# Patient Record
Sex: Male | Born: 1935 | Race: White | Hispanic: No | Marital: Married | Smoking: Never smoker
Health system: Southern US, Community
[De-identification: ages and names within clinical notes are randomized; demographics above are authoritative.]

## PROBLEM LIST (undated history)

## (undated) DIAGNOSIS — E7800 Pure hypercholesterolemia, unspecified: Secondary | ICD-10-CM

## (undated) DIAGNOSIS — R001 Bradycardia, unspecified: Secondary | ICD-10-CM

## (undated) DIAGNOSIS — C801 Malignant (primary) neoplasm, unspecified: Secondary | ICD-10-CM

## (undated) DIAGNOSIS — N189 Chronic kidney disease, unspecified: Secondary | ICD-10-CM

## (undated) DIAGNOSIS — M1990 Unspecified osteoarthritis, unspecified site: Secondary | ICD-10-CM

## (undated) DIAGNOSIS — Z98811 Dental restoration status: Secondary | ICD-10-CM

## (undated) HISTORY — PX: MOHS SURGERY: SUR867

## (undated) HISTORY — PX: PAROTIDECTOMY: SUR1003

---

## 2005-06-06 ENCOUNTER — Emergency Department: Payer: Self-pay | Admitting: Emergency Medicine

## 2005-08-14 ENCOUNTER — Ambulatory Visit: Payer: Self-pay | Admitting: Internal Medicine

## 2007-05-15 IMAGING — US ABDOMEN ULTRASOUND
1 series · 17 of 25 positions shown · non-contrast
Comparison: none

REASON FOR EXAM: Elevated liver functions.  Evaluate for gallstones
COMMENTS:

[Series 1: abdomen ultrasound · 17 of 58 slices shown]
[im 1/58]
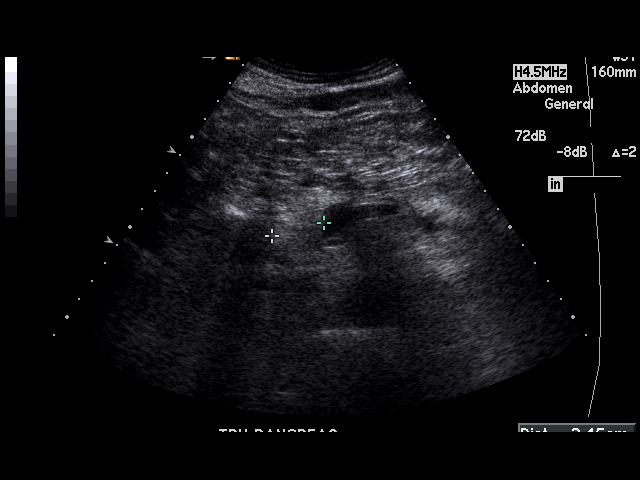
[im 5/58]
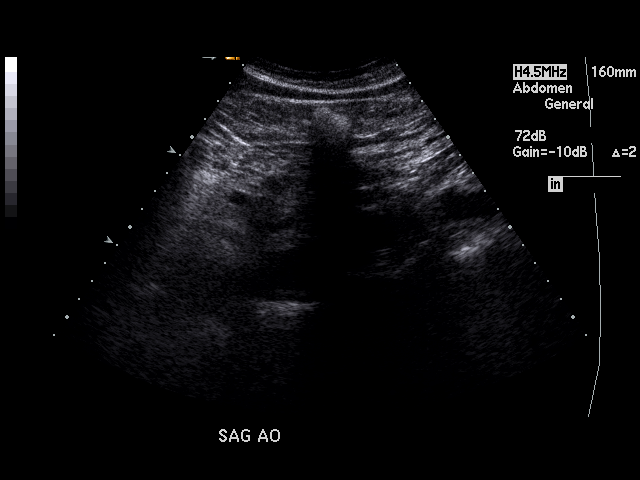
[im 8/58]
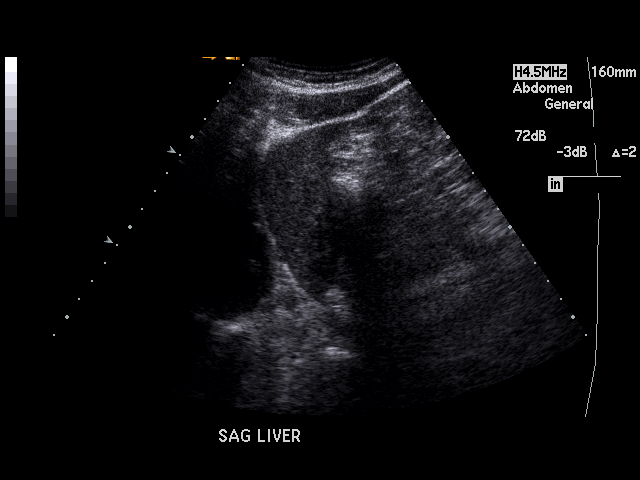
[im 12/58]
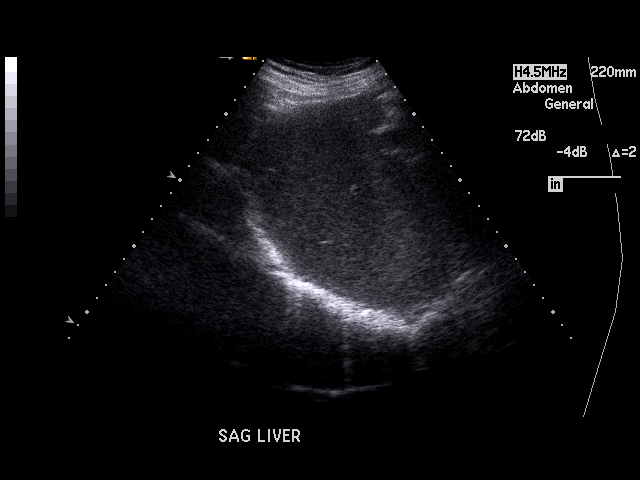
[im 15/58]
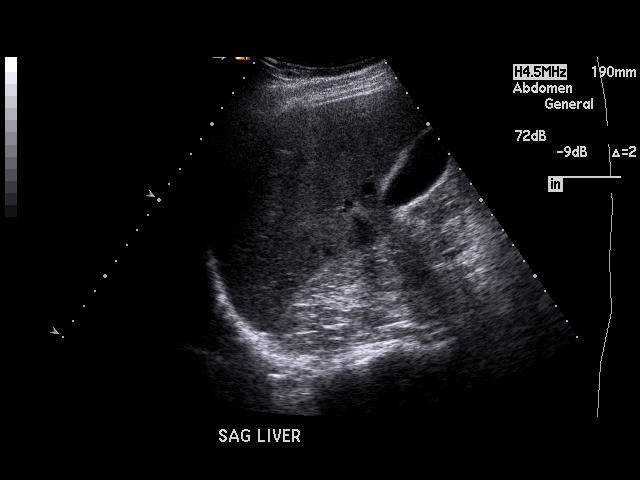
[im 20/58]
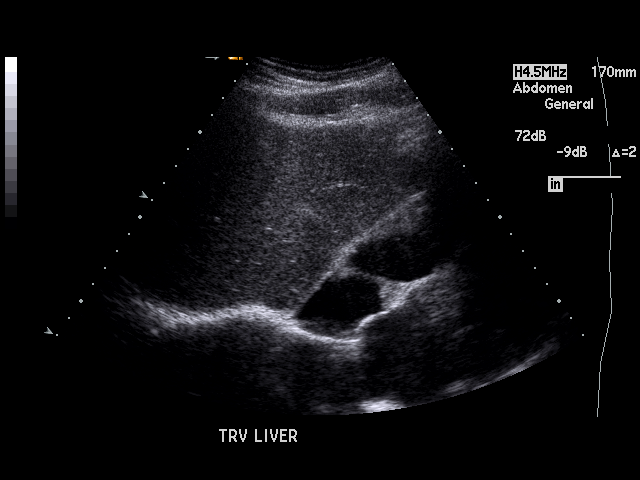
[im 22/58]
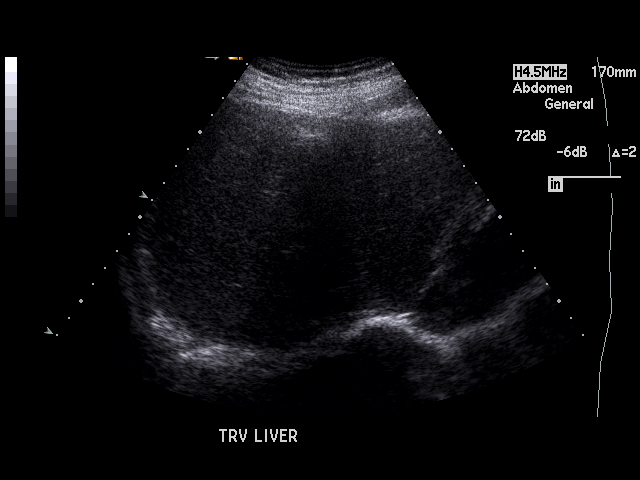
[im 27/58]
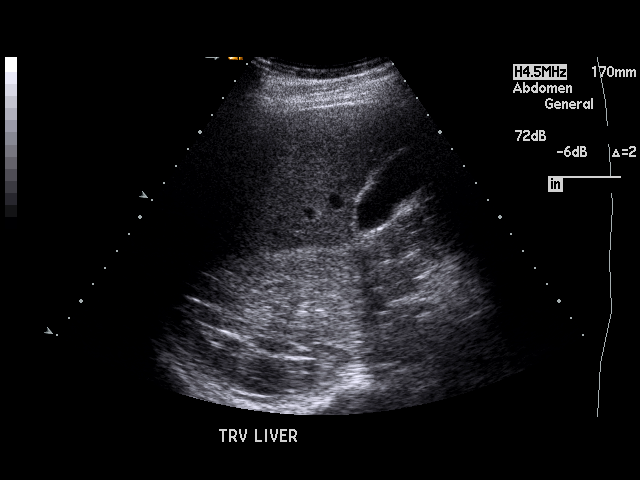
[im 29/58]
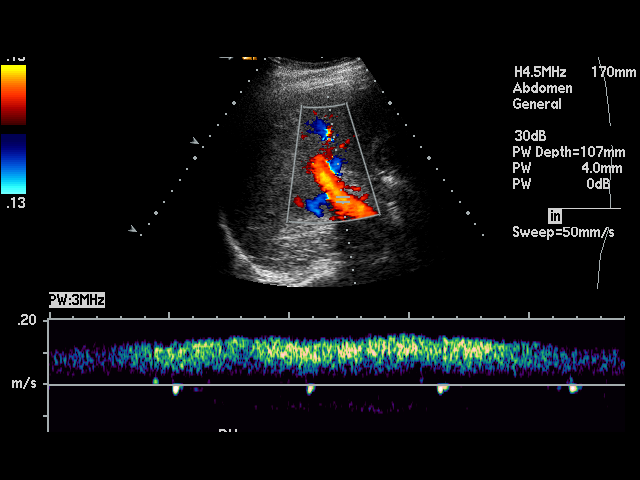
[im 31/58]
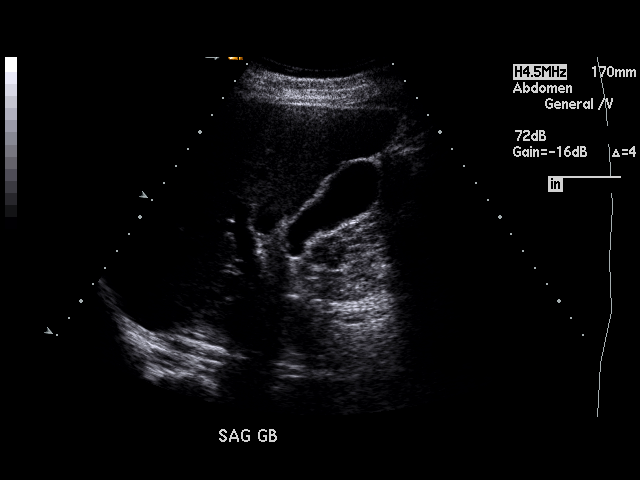
[im 36/58]
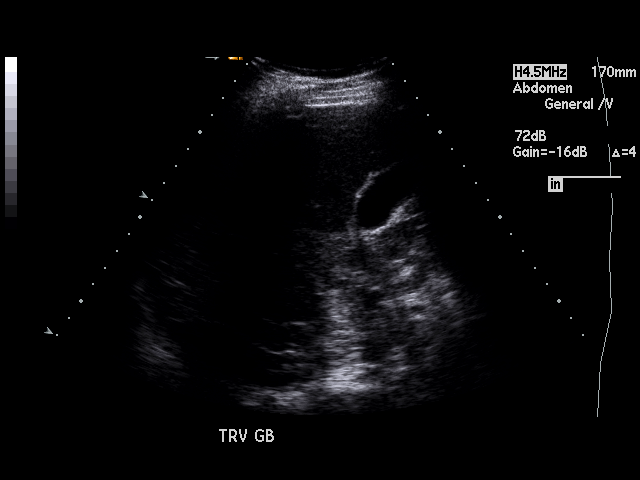
[im 39/58]
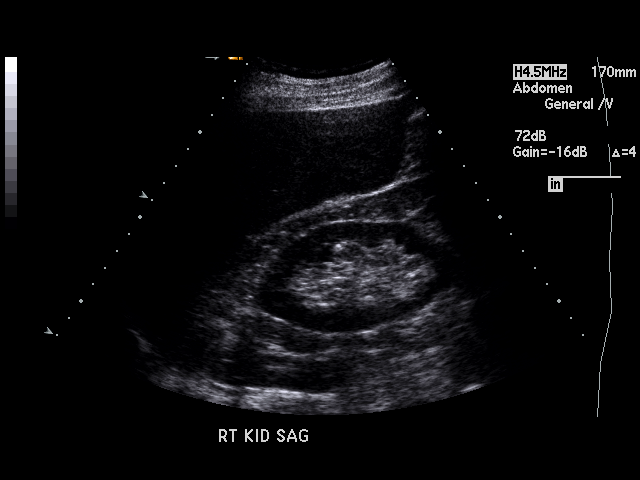
[im 43/58]
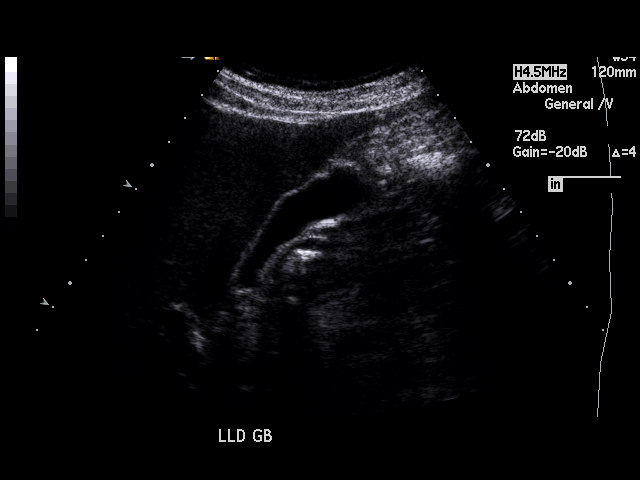
[im 46/58]
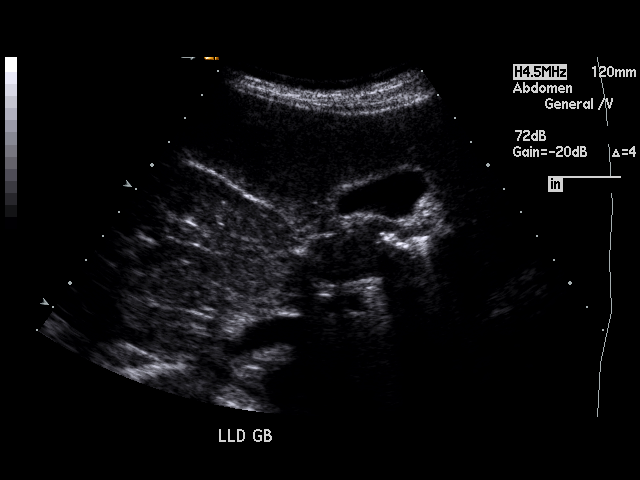
[im 50/58]
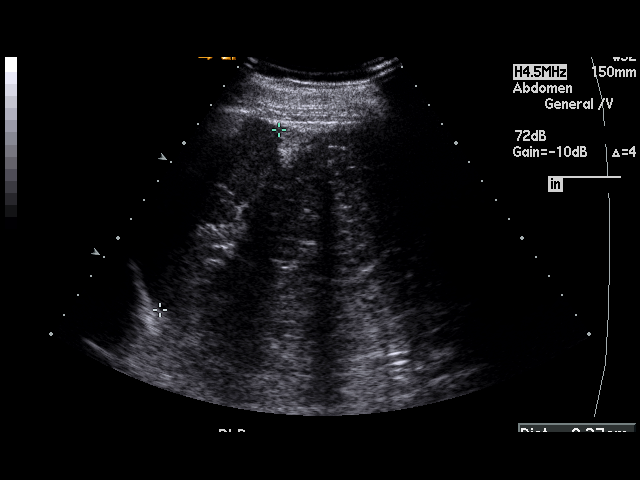
[im 53/58]
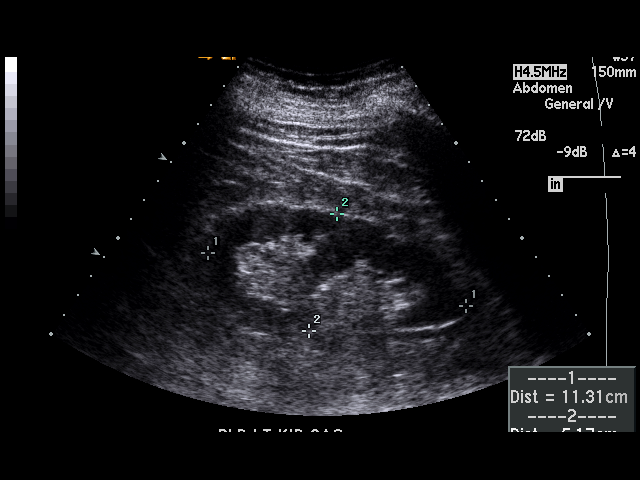
[im 58/58]
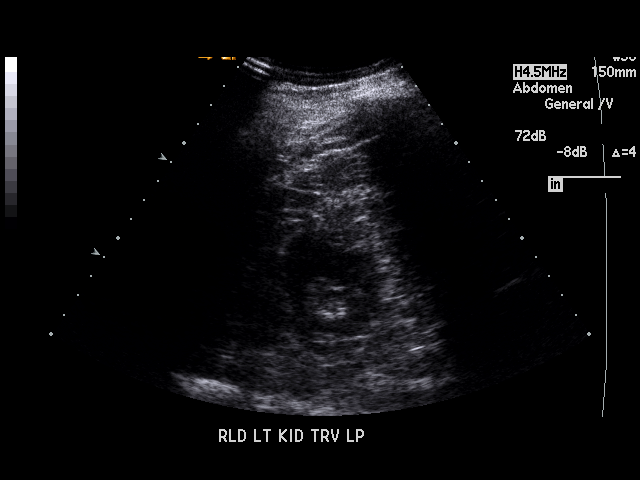

[17 of 25 positions shown; findings below may reference images not displayed]

PROCEDURE:     US  - US ABDOMEN GENERAL SURVEY  - August 14, 2005 [DATE]

RESULT:        The liver demonstrates a homogenous echotexture.  The common
bile duct measures 4.3 mm in diameter.  There is no evidence of intra or
extrahepatic biliary ductal dilatation.  Evaluation of the gallbladder
demonstrates no evidence of pericholecystic fluid, stones, or sludging.
Gallbladder wall thickness is 2.8 mm.  Evaluation of the RIGHT and LEFT
kidneys demonstrates no evidence of hydronephrosis, masses or calculi.
Note, the patient did not demonstrate a sonographic Murphy sign.  The
pancreas is partially visualized and demonstrates no sonographic
abnormalities.  The spleen is homogenous in echotexture and measures 9.3 cm
in longitudinal dimensions.
IMPRESSION: Unremarkable abdominal ultrasound as described above.

## 2016-01-28 ENCOUNTER — Ambulatory Visit: Payer: Medicare PPO | Admitting: Anesthesiology

## 2016-01-28 ENCOUNTER — Encounter
Admission: RE | Disposition: A | Discharge status: Home / Self Care | Payer: Self-pay | Source: Ambulatory Visit | Attending: Ophthalmology

## 2016-01-28 ENCOUNTER — Ambulatory Visit
Admission: RE | Admit: 2016-01-28 | Discharge: 2016-01-28 | Disposition: A | Discharge status: Home / Self Care | Payer: Medicare PPO | Source: Ambulatory Visit | Attending: Ophthalmology | Admitting: Ophthalmology

## 2016-01-28 DIAGNOSIS — Z923 Personal history of irradiation: Secondary | ICD-10-CM | POA: Diagnosis not present

## 2016-01-28 DIAGNOSIS — H02115 Cicatricial ectropion of left lower eyelid: Secondary | ICD-10-CM | POA: Diagnosis not present

## 2016-01-28 DIAGNOSIS — E7800 Pure hypercholesterolemia, unspecified: Secondary | ICD-10-CM | POA: Diagnosis not present

## 2016-01-28 DIAGNOSIS — Z8589 Personal history of malignant neoplasm of other organs and systems: Secondary | ICD-10-CM | POA: Diagnosis not present

## 2016-01-28 HISTORY — DX: Dental restoration status: Z98.811

## 2016-01-28 HISTORY — DX: Unspecified osteoarthritis, unspecified site: M19.90

## 2016-01-28 HISTORY — DX: Malignant (primary) neoplasm, unspecified: C80.1

## 2016-01-28 HISTORY — DX: Bradycardia, unspecified: R00.1

## 2016-01-28 HISTORY — PX: ECTROPION REPAIR: SHX357

## 2016-01-28 HISTORY — DX: Pure hypercholesterolemia, unspecified: E78.00

## 2016-01-28 HISTORY — DX: Chronic kidney disease, unspecified: N18.9

## 2016-01-28 SURGERY — REPAIR, ECTROPION, EYELID
Anesthesia: Monitor Anesthesia Care | Site: Eye | Laterality: Left | Wound class: Clean

## 2016-01-28 MED ORDER — ERYTHROMYCIN 5 MG/GM OP OINT: TOPICAL_OINTMENT | OPHTHALMIC | Status: AC

## 2016-01-28 MED ORDER — ONDANSETRON HCL 4 MG/2ML IJ SOLN
INTRAMUSCULAR | Status: DC | PRN
  Administered 2016-01-28: 4 mg via INTRAVENOUS

## 2016-01-28 MED ORDER — OXYCODONE-ACETAMINOPHEN 5-325 MG PO TABS: 1.0000 | ORAL_TABLET | ORAL | Status: AC | PRN

## 2016-01-28 MED ORDER — BSS IO SOLN
INTRAOCULAR | Status: DC | PRN
  Administered 2016-01-28: 15 mL

## 2016-01-28 MED ORDER — PROPOFOL 10 MG/ML IV BOLUS
INTRAVENOUS | Status: DC | PRN
  Administered 2016-01-28 (×2): 10 mg via INTRAVENOUS

## 2016-01-28 MED ORDER — LIDOCAINE-EPINEPHRINE 2 %-1:100000 IJ SOLN
INTRAMUSCULAR | Status: DC | PRN
  Administered 2016-01-28: 7.5 mL via OPHTHALMIC

## 2016-01-28 MED ORDER — OXYCODONE HCL 5 MG/5ML PO SOLN: 5.0000 mg | Freq: Once | ORAL | Status: DC | PRN

## 2016-01-28 MED ORDER — TETRACAINE HCL 0.5 % OP SOLN
OPHTHALMIC | Status: DC | PRN
  Administered 2016-01-28: 3 [drp] via OPHTHALMIC

## 2016-01-28 MED ORDER — ERYTHROMYCIN 5 MG/GM OP OINT
TOPICAL_OINTMENT | OPHTHALMIC | Status: DC | PRN
  Administered 2016-01-28: 1 via OPHTHALMIC

## 2016-01-28 MED ORDER — LACTATED RINGERS IV SOLN
INTRAVENOUS | Status: DC
  Administered 2016-01-28 (×2): via INTRAVENOUS

## 2016-01-28 MED ORDER — FENTANYL CITRATE (PF) 100 MCG/2ML IJ SOLN
INTRAMUSCULAR | Status: DC | PRN
  Administered 2016-01-28 (×2): 50 ug via INTRAVENOUS

## 2016-01-28 MED ORDER — OXYCODONE HCL 5 MG PO TABS: 5.0000 mg | ORAL_TABLET | Freq: Once | ORAL | Status: DC | PRN

## 2016-01-28 SURGICAL SUPPLY — 36 items
APPLICATOR COTTON TIP WD 3 STR (MISCELLANEOUS) ×6 IMPLANT
BLADE SURG 15 STRL LF DISP TIS (BLADE) ×1 IMPLANT
BLADE SURG 15 STRL SS (BLADE) ×2
CORD BIP STRL DISP 12FT (MISCELLANEOUS) ×3 IMPLANT
DRAPE HEAD BAR (DRAPES) ×3 IMPLANT
GAUZE SPONGE 4X4 12PLY STRL (GAUZE/BANDAGES/DRESSINGS) ×3 IMPLANT
GAUZE SPONGE NON-WVN 2X2 STRL (MISCELLANEOUS) ×10 IMPLANT
GLOVE SURG LX 7.0 MICRO (GLOVE) ×4
GLOVE SURG LX STRL 7.0 MICRO (GLOVE) ×2 IMPLANT
MARKER SKIN XFINE TIP W/RULER (MISCELLANEOUS) ×3 IMPLANT
NEEDLE FILTER BLUNT 18X 1/2SAF (NEEDLE) ×2
NEEDLE FILTER BLUNT 18X1 1/2 (NEEDLE) ×1 IMPLANT
NEEDLE HYPO 30X.5 LL (NEEDLE) ×6 IMPLANT
PACK DRAPE NASAL/ENT (PACKS) ×3 IMPLANT
SOL PREP PVP 2OZ (MISCELLANEOUS) ×3
SOLUTION PREP PVP 2OZ (MISCELLANEOUS) ×1 IMPLANT
SPONGE VERSALON 2X2 STRL (MISCELLANEOUS) ×20
SUT CHROMIC 4-0 (SUTURE)
SUT CHROMIC 4-0 M2 12X2 ARM (SUTURE)
SUT CHROMIC 5 0 P 3 (SUTURE) IMPLANT
SUT ETHILON 4 0 CL P 3 (SUTURE) IMPLANT
SUT MERSILENE 4-0 S-2 (SUTURE) ×3 IMPLANT
SUT PDS AB 4-0 P3 18 (SUTURE) IMPLANT
SUT PLAIN GUT (SUTURE) ×3 IMPLANT
SUT PROLENE 5 0 P 3 (SUTURE) IMPLANT
SUT PROLENE 6 0 P 1 18 (SUTURE) ×3 IMPLANT
SUT SILK 4 0 G 3 (SUTURE) ×3 IMPLANT
SUT VIC AB 5-0 P-3 18X BRD (SUTURE) IMPLANT
SUT VIC AB 5-0 P3 18 (SUTURE)
SUT VICRYL 6-0  S14 CTD (SUTURE) ×4
SUT VICRYL 6-0 S14 CTD (SUTURE) ×2 IMPLANT
SUT VICRYL 7 0 TG140 8 (SUTURE) IMPLANT
SUTURE CHRMC 4-0 M2 12X2 ARM (SUTURE) IMPLANT
SYR 3ML LL SCALE MARK (SYRINGE) ×3 IMPLANT
SYRINGE 10CC LL (SYRINGE) ×3 IMPLANT
WATER STERILE IRR 500ML POUR (IV SOLUTION) ×3 IMPLANT

## 2016-01-28 NOTE — Anesthesia Procedure Notes (Signed)
Procedure Name: MAC Performed by: Octavia Mottola Pre-anesthesia Checklist: Patient identified, Emergency Drugs available, Suction available, Timeout performed and Patient being monitored Patient Re-evaluated:Patient Re-evaluated prior to inductionOxygen Delivery Method: Nasal cannula Placement Confirmation: positive ETCO2     

## 2016-01-28 NOTE — H&P (Signed)
  See history and physical performed at [facility] on 01/14/2016 that is scanned into the chart.

## 2016-01-28 NOTE — Anesthesia Preprocedure Evaluation (Addendum)
Anesthesia Evaluation  Patient identified by MRN, date of birth, ID band Patient awake    Reviewed: Allergy & Precautions, H&P , NPO status , Patient's Chart, lab work & pertinent test results  History of Anesthesia Complications Negative for: history of anesthetic complications  Airway Mallampati: II  TM Distance: >3 FB Neck ROM: full  Mouth opening: Limited Mouth Opening Comment: Slightly small mouth opening- bilateral radiation for parotid cancer. Dental no notable dental hx.    Pulmonary neg pulmonary ROS,    Pulmonary exam normal        Cardiovascular negative cardio ROS Normal cardiovascular exam     Neuro/Psych    GI/Hepatic negative GI ROS, Neg liver ROS,   Endo/Other  negative endocrine ROS  Renal/GU negative Renal ROS     Musculoskeletal   Abdominal   Peds  Hematology negative hematology ROS (+)   Anesthesia Other Findings   Reproductive/Obstetrics                            Anesthesia Physical Anesthesia Plan  ASA: II  Anesthesia Plan: MAC   Post-op Pain Management:    Induction:   Airway Management Planned:   Additional Equipment:   Intra-op Plan:   Post-operative Plan:   Informed Consent: I have reviewed the patients History and Physical, chart, labs and discussed the procedure including the risks, benefits and alternatives for the proposed anesthesia with the patient or authorized representative who has indicated his/her understanding and acceptance.     Plan Discussed with: CRNA  Anesthesia Plan Comments:         Anesthesia Quick Evaluation

## 2016-01-28 NOTE — Transfer of Care (Signed)
Immediate Anesthesia Transfer of Care Note  Patient: Mathew Bruce  Procedure(s) Performed: Procedure(s): REPAIR OF ECTROPIAN LEFT EYE LOWER WITH SKIN GRAFT FULL THICKNESS FROM UPPER LEFT LID TO LEFT LOWER LID WITH TARSAL STRIP (Left)  Patient Location: PACU  Anesthesia Type: MAC  Level of Consciousness: awake, alert  and patient cooperative  Airway and Oxygen Therapy: Patient Spontanous Breathing and Patient connected to supplemental oxygen  Post-op Assessment: Post-op Vital signs reviewed, Patient's Cardiovascular Status Stable, Respiratory Function Stable, Patent Airway and No signs of Nausea or vomiting  Post-op Vital Signs: Reviewed and stable  Complications: No apparent anesthesia complications

## 2016-01-28 NOTE — Op Note (Signed)
Preoperative Diagnosis:   1.  Cicatricial ectropion left lower eyelid 2.  Lower eyelid laxity with ectropion, left  lower eyelid.  Postoperative Diagnosis:   Same.  Procedure(s) Performed:  1.  Full Thickness Skin Graft left lower eyelid 2.  Lateral tarsal strip procedure,  left  lower eyelid.  Teaching Surgeon: Philis Pique. Vickki Muff, M.D.  Assistants: none  Anesthesia: MAC  Specimens: None.  Estimated Blood Loss: Minimal.  Complications: None.  Operative Findings: None   Procedure:   Allergies were reviewed and the patient Review of patient's allergies indicates no known allergies..    After discussing the risks, benefits, complications, and alternatives with the patient, appropriate informed consent was obtained. The patient was brought to the operating suite and reclined supine. Time out was conducted and the patient was sedated.  Local anesthetic consisting of a 50-50 mixture of 2% lidocaine with epinephrine and 0.75% bupivacaine with added Hylenex was injected subcutaneously to the left lateral canthal region(s) and lower eyelid. Subcutaneous anesthetic was injected to the left upper eyelid where the graft would be harvested from.   Additional anesthetic was injected subconjunctivally to the left lower eyelid. Finally, anesthetic was injected down to the periosteum of the left lateral orbital rim.  After adequate local was instilled, the patient was prepped and draped in the usual sterile fashion for eyelid surgery.   Attention was turned to the left lower eyelid. A subciliary incision line was marked with a sterile marking pen. A #15 blade was used to open the incision line and hemostasis was obtained with bipolar cautery. The lower eyelid skin was then elevated approximately 10 cm. Hemostasis was obtained with bipolar cautery.  Attention was turned to the left lateral canthal angle. Westcott scissors were used to create a lateral canthotomy. Hemostasis was obtained with bipolar  cautery. An inferior cantholysis was then performed with additional bipolar hemostasis. The anterior and posterior lamella of the lid were divided for approximately 8 mm.  A strip of the epithelium was excised off the superior margin of the tarsal strip and conjunctiva and retractors were incised off the inferior margin of the tarsal strip. A double-armed 4-0 Mersilene suture was then passed each arm through the terminal portion of the tarsal strip. Each arm of the suture was then passed through the periosteum of the inner portion of the lateral orbital rim at the level of Whitnall's tubercle. The sutures were advanced and this provided nice elevation and tightening of the lower eyelid. Once the suture was secured, a thin strip of follicle-bearing skin was excised. The lateral canthal angle was reformed with an interrupted 6-0 Vicryl suture. Orbicularis was reapproximated with horizontal subcuticular 6-0 fast absorbing plain gut sutures.   Attention was then turned to the left upper eyelid. A  48mm lid crease incision line was marked with a caliper. A pinch test was used to estimate the maximal amount of skin to be excised and this was marked in standard blepharoplasty style fashion. A #15 blade was used to open the premarked incision line. A skin only flap was excised and wrapped in sterile saline gauze and set aside. The skin was closed with interrupted and running 6-0 fast absorbing plain gut sutures.   The skin graft was then placed in the lower eyelid bed. It was secured into place with interrupted 6-0 Vicryl sutures.  A Frost suture over a butterfly tubing bolster was then placed with a double-armed 4-0 silk to put the lower eyelid on vertical upward traction.  Erythromycin Ophthalmic  ointment was placed on all the incision sites. A Telfa pad was trimmed to the size of the skin graft. A rolled bolster was then sutured over the Telfa pad with interrupted 4-0 silk sutures. A pressure patch was then applied.    The patient tolerated the procedure well. The patient was taken to the recovery area where he recovered without difficulty.  Post-Op Plan/Instructions:  The patient was instructed to keep his pressure patch in place for the next 7 days.Marland Kitchen He was given a prescription for Percocet for pain control should Tylenol not be effective. He was asked to to follow up at the [facility] in [place], [place] in1 weeks' time  for patch and bolster removal or sooner as needed for problems.   Alyra Patty M. Vickki Muff, M.D. Attending,Ophthalmology

## 2016-01-28 NOTE — Interval H&P Note (Signed)
History and Physical Interval Note:  01/28/2016 11:34 AM  Mathew Bruce  has presented today for surgery, with the diagnosis of H02.135 ECTROPION OF LEFT LOWER EYELID H02.015 CICATRICIAL ECTROPION OF LEFT LOWER EYELID  The various methods of treatment have been discussed with the patient and family. After consideration of risks, benefits and other options for treatment, the patient has consented to  Procedure(s): REPAIR OF ECTROPION GRAFT SKIN FTG NOSE EAR EYELID (Left) as a surgical intervention .  The patient's history has been reviewed, patient examined, no change in status, stable for surgery.  I have reviewed the patient's chart and labs.  Questions were answered to the patient's satisfaction.     Vickki Muff, Jailani Hogans M

## 2016-01-28 NOTE — Anesthesia Postprocedure Evaluation (Signed)
Anesthesia Post Note  Patient: Mathew Bruce  Procedure(s) Performed: Procedure(s) (LRB): REPAIR OF ECTROPIAN LEFT EYE LOWER WITH SKIN GRAFT FULL THICKNESS FROM UPPER LEFT LID TO LEFT LOWER LID WITH TARSAL STRIP (Left)  Patient location during evaluation: PACU Anesthesia Type: MAC Level of consciousness: awake and alert Pain management: pain level controlled Vital Signs Assessment: post-procedure vital signs reviewed and stable Respiratory status: spontaneous breathing and respiratory function stable Cardiovascular status: stable Anesthetic complications: no    Reda Gettis, III,  Aakash Hollomon D

## 2016-01-28 NOTE — Discharge Instructions (Signed)
INSTRUCTIONS FOLLOWING OCULOPLASTIC SURGERY AMY Dennie Maizes, MD  REMOVE PATCH IN 1 WEEK. BEGIN EYE OINTMENT AT THIS TIME.  AFTER YOUR EYE SURGERY, THER ARE MANY THINGS THWIHC YOU, THE PATIENT, CAN DO TO ASSURE THE BEST POSSIBLE RESULT FROM YOUR OPERATION.  THIS SHEET SHOULD BE REFERRED TO WHENEVER QUESTIONS ARISE.  IF THERE ARE ANY QUESTIONS NOT ANSWERED HERE, DO NOT HESITATE TO CALL OUR OFFICE AT [phone] OR [phone].  THERE IS ALWAYS [facility] AVAILABLE TO CALL IF QUESTIONS OR PROBLEMS ARISE.  VISION: Your vision may be blurred and out of focus after surgery until you are able to stop using your ointment, swelling resolves and your eye(s) heal. This may take 1 to 2 weeks at the least.  If your vision becomes gradually more dim or dark, this is not normal and you need to call our office immediately.  EYE CARE: For the first 48 hours after surgery, use ice packs frequently - 20 minutes on, 20 minutes off - to help reduce swelling and bruising.  Small bags of frozen peas or corn make good ice packs along with cloths soaked in ice water.  If you are wearing a patch or other type of dressing following surgery, keep this on for the amount of time specified by your doctor.  For the first week following surgery, you will need to treat your stitches with great care.  If is OK to shower, but take care to not allow soapy water to run into your eye(s) to help reduce changes of infection.  You may gently clean the eyelashes and around the eye(s) with cotton balls and sterile water, BUT DO NOT RUB THE STITCHES VIGOROUSLY.  Keeping your stitches moist with ointment will help promote healing with minimal scar formation.  ACTIVITY: When you leave the surgery center, you should go home, rest and be inactive.  The eye(s) may feel scratchy and keeping the eyes closed will allow for faster healing.  The first week following surgery, avoid straining (anything making the face turn red) or lifting over 20 pounds.   Additionally, avoid bending which causes your head to go below your waist.  Using your eyes will NOT harm them, so feel free to read, watch television, use the computer, etc as desired.  Driving depends on each individual, so check with your doctor if you have questions about driving.  MEDICATIONS:  You will be given a prescription for an ointment to use 4 times a day on your stitches.  You can use the ointment in your eyes if they feel scratchy or irritated.  If you eyelid(s) dont close completely when you sleep, put some ointment in your eyes before bedtime.   EMERGENCY: If you experience SEVERE EYE PAIN OR HEADACHE UNRELIEVED BY TYLENOL OR PERCOCET, NAUSEA OR VOMITING, WORSENING REDNESS, OR WORSENING VISION (ESPECIALLY VISION THAT WA INITIALLY BETTER) CALL [phone] OR [phone] DURING BUSINESS HOURS OR AFTER HOURS.      General Anesthesia, Adult, Care After Refer to this sheet in the next few weeks. These instructions provide you with information on caring for yourself after your procedure. Your health care provider may also give you more specific instructions. Your treatment has been planned according to current medical practices, but problems sometimes occur. Call your health care provider if you have any problems or questions after your procedure. WHAT TO EXPECT AFTER THE PROCEDURE After the procedure, it is typical to experience:  Sleepiness.  Nausea and vomiting. HOME CARE INSTRUCTIONS  For the first 24 hours after  general anesthesia:  Have a responsible person with you.  Do not drive a car. If you are alone, do not take public transportation.  Do not drink alcohol.  Do not take medicine that has not been prescribed by your health care provider.  Do not sign important papers or make important decisions.  You may resume a normal diet and activities as directed by your health care provider.  Change bandages (dressings) as directed.  If you have questions or  problems that seem related to general anesthesia, call the hospital and ask for the anesthetist or anesthesiologist on call. SEEK MEDICAL CARE IF:  You have nausea and vomiting that continue the day after anesthesia.  You develop a rash. SEEK IMMEDIATE MEDICAL CARE IF:   You have difficulty breathing.  You have chest pain.  You have any allergic problems.   This information is not intended to replace advice given to you by your health care provider. Make sure you discuss any questions you have with your health care provider.   Document Released: 01/18/2001 Document Revised: 11/02/2014 Document Reviewed: 02/10/2012 Elsevier Interactive Patient Education [employer].

## 2016-01-29 ENCOUNTER — Encounter: Payer: Self-pay | Admitting: Ophthalmology

## 2020-10-01 ENCOUNTER — Emergency Department: Payer: Medicare PPO

## 2020-10-01 ENCOUNTER — Emergency Department
Admission: EM | Admit: 2020-10-01 | Discharge: 2020-10-26 | Disposition: E | Payer: Medicare PPO | Attending: Student in an Organized Health Care Education/Training Program | Admitting: Student in an Organized Health Care Education/Training Program

## 2020-10-01 ENCOUNTER — Other Ambulatory Visit: Payer: Self-pay

## 2020-10-01 DIAGNOSIS — Z20822 Contact with and (suspected) exposure to covid-19: Secondary | ICD-10-CM | POA: Insufficient documentation

## 2020-10-01 DIAGNOSIS — Z85828 Personal history of other malignant neoplasm of skin: Secondary | ICD-10-CM | POA: Insufficient documentation

## 2020-10-01 DIAGNOSIS — R0602 Shortness of breath: Secondary | ICD-10-CM | POA: Insufficient documentation

## 2020-10-01 DIAGNOSIS — N189 Chronic kidney disease, unspecified: Secondary | ICD-10-CM | POA: Insufficient documentation

## 2020-10-01 DIAGNOSIS — Y92009 Unspecified place in unspecified non-institutional (private) residence as the place of occurrence of the external cause: Secondary | ICD-10-CM | POA: Diagnosis not present

## 2020-10-01 DIAGNOSIS — R4182 Altered mental status, unspecified: Secondary | ICD-10-CM | POA: Insufficient documentation

## 2020-10-01 DIAGNOSIS — Z85858 Personal history of malignant neoplasm of other endocrine glands: Secondary | ICD-10-CM | POA: Diagnosis not present

## 2020-10-01 DIAGNOSIS — W19XXXA Unspecified fall, initial encounter: Secondary | ICD-10-CM | POA: Diagnosis not present

## 2020-10-01 LAB — COMPREHENSIVE METABOLIC PANEL
ALT: 102 U/L — ABNORMAL HIGH (ref 0–44)
AST: 121 U/L — ABNORMAL HIGH (ref 15–41)
Albumin: 2.9 g/dL — ABNORMAL LOW (ref 3.5–5.0)
Alkaline Phosphatase: 120 U/L (ref 38–126)
Anion gap: 10 (ref 5–15)
BUN: 50 mg/dL — ABNORMAL HIGH (ref 8–23)
CO2: 34 mmol/L — ABNORMAL HIGH (ref 22–32)
Calcium: 9 mg/dL (ref 8.9–10.3)
Chloride: 90 mmol/L — ABNORMAL LOW (ref 98–111)
Creatinine, Ser: 1.22 mg/dL (ref 0.61–1.24)
GFR, Estimated: 57 mL/min — ABNORMAL LOW (ref >60)
Glucose, Bld: 237 mg/dL — ABNORMAL HIGH (ref 70–99)
Potassium: 4.4 mmol/L (ref 3.5–5.1)
Sodium: 134 mmol/L — ABNORMAL LOW (ref 135–145)
Total Bilirubin: 1.7 mg/dL — ABNORMAL HIGH (ref 0.3–1.2)
Total Protein: 6.4 g/dL — ABNORMAL LOW (ref 6.5–8.1)

## 2020-10-01 LAB — CBC WITH DIFFERENTIAL/PLATELET
Abs Immature Granulocytes: 0.44 10*3/uL — ABNORMAL HIGH (ref 0.00–0.07)
Basophils Absolute: 0.1 10*3/uL (ref 0.0–0.1)
Basophils Relative: 0 %
Eosinophils Absolute: 0 10*3/uL (ref 0.0–0.5)
Eosinophils Relative: 0 %
HCT: 31.1 % — ABNORMAL LOW (ref 39.0–52.0)
Hemoglobin: 10.2 g/dL — ABNORMAL LOW (ref 13.0–17.0)
Immature Granulocytes: 2 %
Lymphocytes Relative: 6 %
Lymphs Abs: 1.1 10*3/uL (ref 0.7–4.0)
MCH: 34.3 pg — ABNORMAL HIGH (ref 26.0–34.0)
MCHC: 32.8 g/dL (ref 30.0–36.0)
MCV: 104.7 fL — ABNORMAL HIGH (ref 80.0–100.0)
Monocytes Absolute: 1.2 10*3/uL — ABNORMAL HIGH (ref 0.1–1.0)
Monocytes Relative: 7 %
Neutro Abs: 15.1 10*3/uL — ABNORMAL HIGH (ref 1.7–7.7)
Neutrophils Relative %: 85 %
Platelets: 311 10*3/uL (ref 150–400)
RBC: 2.97 MIL/uL — ABNORMAL LOW (ref 4.22–5.81)
RDW: 17 % — ABNORMAL HIGH (ref 11.5–15.5)
WBC: 18 10*3/uL — ABNORMAL HIGH (ref 4.0–10.5)
nRBC: 1.6 % — ABNORMAL HIGH (ref 0.0–0.2)

## 2020-10-01 LAB — RESP PANEL BY RT-PCR (FLU A&B, COVID) ARPGX2
Influenza A by PCR: NEGATIVE
Influenza B by PCR: NEGATIVE
SARS Coronavirus 2 by RT PCR: NEGATIVE

## 2020-10-01 LAB — CBG MONITORING, ED: Glucose-Capillary: 177 mg/dL — ABNORMAL HIGH (ref 70–99)

## 2020-10-01 MED ORDER — GLYCOPYRROLATE 0.2 MG/ML IJ SOLN
0.2000 mg | INTRAMUSCULAR | Status: DC | PRN
  Filled 2020-10-01: qty 1

## 2020-10-01 MED ORDER — ACETAMINOPHEN 325 MG PO TABS: 650.0000 mg | ORAL_TABLET | Freq: Four times a day (QID) | ORAL | Status: DC | PRN

## 2020-10-01 MED ORDER — MORPHINE SULFATE (CONCENTRATE) 10 MG/0.5ML PO SOLN: 5.0000 mg | ORAL | Status: DC | PRN

## 2020-10-01 MED ORDER — GLYCOPYRROLATE 1 MG PO TABS
1.0000 mg | ORAL_TABLET | ORAL | Status: DC | PRN
  Filled 2020-10-01: qty 1

## 2020-10-01 MED ORDER — MORPHINE SULFATE (CONCENTRATE) 10 MG/0.5ML PO SOLN
5.0000 mg | ORAL | Status: DC | PRN
  Administered 2020-10-01 (×2): 5 mg via ORAL
  Filled 2020-10-01 (×2): qty 0.5

## 2020-10-01 MED ORDER — MORPHINE SULFATE (PF) 2 MG/ML IV SOLN
2.0000 mg | INTRAVENOUS | Status: DC | PRN
  Administered 2020-10-01: 2 mg via INTRAVENOUS
  Filled 2020-10-01: qty 1

## 2020-10-01 MED ORDER — ACETAMINOPHEN 650 MG RE SUPP: 650.0000 mg | Freq: Four times a day (QID) | RECTAL | Status: DC | PRN

## 2020-10-26 NOTE — ED Notes (Signed)
Per EMS pt had an accidental fall earlier this morning at home ( Hit the top of his head ) , pt has become less response, and having some breathing difficulty .  Per wife pt has been confused since yesterday and has had a cough with green sputum

## 2020-10-26 NOTE — ED Notes (Signed)
[facility] arrived, documentation given

## 2020-10-26 NOTE — ED Provider Notes (Signed)
[facility] Emergency Department Provider Note    First MD Initiated Contact with Patient 28-Oct-2020 1509     (approximate)  I have reviewed the triage vital signs and the nursing notes.   HISTORY  Chief Complaint Altered Mental Status (Per EMS pt had a fall earlier this morning hit the top of his head, has become alt, with breathing difficulty . Pt is also a cancer and hospice pt )  Level v Caveat:  AMS  HPI Mathew Bruce is a 87 y.o. male   presents to the ER via EMS after increasing altered mental status falls and significant decline in status with worsening shortness of breath.  Patient is known cancer patient poorly in hospice care with the [facility] hospice.  Wife states that he was having increasing confusion shortness of breath of the past 24 hours had a fall this morning.  She was worried that he is in the process of dying and had called hospice nurse to come to the home but they were unable to come until tomorrow so she called EMS to come to the ER.     Past Medical History:  Diagnosis Date  . Arthritis   . Bradycardia   . Cancer ([place])    parotid and skin  . Chronic kidney disease    H/O kidney stones  . Dental crowns present   . Hypercholesterolemia    No family history on file. Past Surgical History:  Procedure Laterality Date  . ECTROPION REPAIR Left 01/28/2016   Procedure: REPAIR OF ECTROPIAN LEFT EYE LOWER WITH SKIN GRAFT FULL THICKNESS FROM UPPER LEFT LID TO LEFT LOWER LID WITH TARSAL STRIP;  Surgeon: Karle Starch, MD;  Location: [facility];  Service: Ophthalmology;  Laterality: Left;  . MOHS SURGERY    . PAROTIDECTOMY     There are no problems to display for this patient.     Prior to Admission medications   Medication Sig Start Date End Date Taking? Authorizing Provider  acetaminophen (TYLENOL) 650 MG CR tablet Take 650 mg by mouth every 8 (eight) hours as needed for pain.    [provider]  erythromycin  [facility]) ophthalmic ointment Use a small amount on your sutures 4 times a day for the next 2 weeks. Switch to Aquaphor ointment should allergy develop. 01/28/16   Karle Starch, MD  oxyCODONE-acetaminophen (PERCOCET) 5-325 MG tablet Take 1 tablet by mouth every 4 (four) hours as needed for severe pain. 01/28/16   Karle Starch, MD    Allergies Patient has no known allergies.    Social History Social History   Tobacco Use  . Smoking status: Never Smoker  Substance Use Topics  . Alcohol use: Yes    Comment: occasional  . Drug use: No    Review of Systems Patient denies headaches, rhinorrhea, blurry vision, numbness, shortness of breath, chest pain, edema, cough, abdominal pain, nausea, vomiting, diarrhea, dysuria, fevers, rashes or hallucinations unless otherwise stated above in HPI. ____________________________________________   PHYSICAL EXAM:  VITAL SIGNS: Vitals:   2020-10-28 1930 October 28, 2020 2115  BP: (!) 75/53 (!) 123/103  Pulse: (!) 114 (!) 119  Resp: (!) 22 (!) 31  Temp:    SpO2: (!) 79% (!) 64%    Constitutional:  AMS, chronically ill appearing  Eyes: Conjunctivae are normal.  Head: abrasion/contusion to right forehead Nose: No congestion/rhinnorhea. Mouth/Throat: Mucous membranes are dry  Neck: No stridor. Painless ROM.  Cardiovascular: Normal rate, regular rhythm. Grossly normal heart  sounds.  Good peripheral circulation. Respiratory: , spontaneous respirations with intermittent episodes of apnea followed by brief agonal resp Gastrointestinal: Soft  No distention. No abdominal bruits. No CVA tenderness. Genitourinary: deferred Musculoskeletal: No lower extremity tenderness Neurologic:  GCS 3 Skin:  Skin is warm, dry and intact. No rash noted. Psychiatric: unable to assess ____________________________________________   LABS (all labs ordered are listed, but only abnormal results are displayed)  Results for orders placed or performed during the hospital encounter  of 10-12-20 (from the past 24 hour(s))  CBC with Differential     Status: Abnormal   Collection Time: 10-12-2020  3:11 PM  Result Value Ref Range   WBC 18.0 (H) 4.0 - 10.5 K/uL   RBC 2.97 (L) 4.22 - 5.81 MIL/uL   Hemoglobin 10.2 (L) 13.0 - 17.0 g/dL   HCT 31.1 (L) 39 - 52 %   MCV 104.7 (H) 80.0 - 100.0 fL   MCH 34.3 (H) 26.0 - 34.0 pg   MCHC 32.8 30.0 - 36.0 g/dL   RDW 17.0 (H) 11.5 - 15.5 %   Platelets 311 150 - 400 K/uL   nRBC 1.6 (H) 0.0 - 0.2 %   Neutrophils Relative % 85 %   Neutro Abs 15.1 (H) 1.7 - 7.7 K/uL   Lymphocytes Relative 6 %   Lymphs Abs 1.1 0.7 - 4.0 K/uL   Monocytes Relative 7 %   Monocytes Absolute 1.2 (H) 0.1 - 1.0 K/uL   Eosinophils Relative 0 %   Eosinophils Absolute 0.0 0.0 - 0.5 K/uL   Basophils Relative 0 %   Basophils Absolute 0.1 0.0 - 0.1 K/uL   Immature Granulocytes 2 %   Abs Immature Granulocytes 0.44 (H) 0.00 - 0.07 K/uL  Comprehensive metabolic panel     Status: Abnormal   Collection Time: 10/12/2020  3:11 PM  Result Value Ref Range   Sodium 134 (L) 135 - 145 mmol/L   Potassium 4.4 3.5 - 5.1 mmol/L   Chloride 90 (L) 98 - 111 mmol/L   CO2 34 (H) 22 - 32 mmol/L   Glucose, Bld 237 (H) 70 - 99 mg/dL   BUN 50 (H) 8 - 23 mg/dL   Creatinine, Ser 1.22 0.61 - 1.24 mg/dL   Calcium 9.0 8.9 - 10.3 mg/dL   Total Protein 6.4 (L) 6.5 - 8.1 g/dL   Albumin 2.9 (L) 3.5 - 5.0 g/dL   AST 121 (H) 15 - 41 U/L   ALT 102 (H) 0 - 44 U/L   Alkaline Phosphatase 120 38 - 126 U/L   Total Bilirubin 1.7 (H) 0.3 - 1.2 mg/dL   GFR, Estimated 57 (L) >60 mL/min   Anion gap 10 5 - 15  CBG monitoring, ED     Status: Abnormal   Collection Time: 2020-10-12  3:12 PM  Result Value Ref Range   Glucose-Capillary 177 (H) 70 - 99 mg/dL  Resp Panel by RT-PCR (Flu A&B, Covid) Nasopharyngeal Swab     Status: None   Collection Time: 10-12-2020  7:06 PM   Specimen: Nasopharyngeal Swab; Nasopharyngeal(NP) swabs in vial transport medium  Result Value Ref Range   SARS Coronavirus 2 by RT PCR  NEGATIVE NEGATIVE   Influenza A by PCR NEGATIVE NEGATIVE   Influenza B by PCR NEGATIVE NEGATIVE   ____________________________________________  EKG My review and personal interpretation at Time: 15:06   Indication: ams  Rate: 99  Rhythm: afib Axis: normal Other: nonspecific st abn ____________________________________________  RADIOLOGY  I personally reviewed all radiographic images  ordered to evaluate for the above acute complaints and reviewed radiology reports and findings.  These findings were personally discussed with the patient.  Please see medical record for radiology report.  ____________________________________________   PROCEDURES  Procedure(s) performed:  Procedures    Critical Care performed: no ____________________________________________   INITIAL IMPRESSION / ASSESSMENT AND PLAN / ED COURSE  Pertinent labs & imaging results that were available during my care of the patient were reviewed by me and considered in my medical decision making (see chart for details).   DDX: SDH, IPH, metabolic encephalopathy, hypercapnic respiratory failure, hypoxic respiratory failure, sepsis  BRYNDAN BILYK is a 87 y.o. who presents to the ED with presentation as described above. Patient does appear to be actively dying. He is a DNR DNI. Wife at bedside states that does not want any other diagnostic testing or imaging done as goals of care are comfort measures only. Will place comfort measures which will include administering p.o. as well as IV morphine for symptom control. Will consult palliative care to see if there or hospice home beds available as I think the patient would be a good candidate.  Clinical Course as of Oct 01 2218  Tue 10-28-20 I have consulted with the palliative care team and unfortunately they do not have any availability at hospice house.  I will continue to manage patient's symptoms here and observe for few hours as he does appear to be actively  dying and may do so in the near term.  If does not show any signs of rapid deterioration will discuss with hospitalist for admission for comfort measures.   [PR]  2208 Noted the patient had increased apneic episodes. Went to reassess patient he was with no spontaneous respirations pulseless. Time of death called at 14. Family at bedside was provided support.     [PR]    Clinical Course User Index [PR] Merlyn Lot, MD    The patient was evaluated in Emergency Department today for the symptoms described in the history of present illness. He/she was evaluated in the context of the global COVID-19 pandemic, which necessitated consideration that the patient might be at risk for infection with the SARS-CoV-2 virus that causes COVID-19. Institutional protocols and algorithms that pertain to the evaluation of patients at risk for COVID-19 are in a state of rapid change based on information released by regulatory bodies including the CDC and federal and state organizations. These policies and algorithms were followed during the patient's care in the ED.  As part of my medical decision making, I reviewed the following data within the [place] notes reviewed and incorporated, Labs reviewed, notes from prior ED visits and [place] Controlled Substance Database   ____________________________________________   FINAL CLINICAL IMPRESSION(S) / ED DIAGNOSES  Final diagnoses:  Altered mental status, unspecified altered mental status type  Fall, initial encounter      NEW MEDICATIONS STARTED DURING THIS VISIT:  New Prescriptions   No medications on file     Note:  This document was prepared using Dragon voice recognition software and may include unintentional dictation errors.    Merlyn Lot, MD 10/28/2020 2219

## 2020-10-26 NOTE — ED Notes (Signed)
Time of death 22:05 , Wife at bedside when patient passed.     [facility] contacted , Pt is not a candidate.  [place]

## 2020-10-26 NOTE — Progress Notes (Signed)
Received call from ED. Discussed patient with Dr. Quentin Cornwall. DNR. Patient is actively dying. Plan is for comfort measures only. Dr. Quentin Cornwall wondering if we can transfer him straight to hospice facility in [place]. Unfortunately, no hospice beds available today. Recommend hospital admission for EOL/comfort care. Can pursue hospice facility transfer in the days to come if appropriate. Thank you.   NO CHARGE  Ihor Dow, [place], FNP-C Palliative Medicine Team  Phone: [phone] Fax: [phone]

## 2020-10-26 NOTE — ED Notes (Signed)
Spoke with MD

## 2020-10-26 NOTE — ED Notes (Signed)
Wife at bedside speaking with MD. Shelva Majestic

## 2020-10-26 NOTE — ED Notes (Signed)
ED Provider at bedside. 

## 2020-10-26 NOTE — ED Notes (Signed)
Family at bedside. 

## 2020-10-26 DEATH — deceased

## 2022-07-02 IMAGING — DX DG CHEST 1V PORT
1 series · 1 of 1 positions shown · non-contrast
Comparison: None.

CLINICAL DATA: Fall.  Altered mental status.

EXAM:
PORTABLE CHEST 1 VIEW

[chest ap]
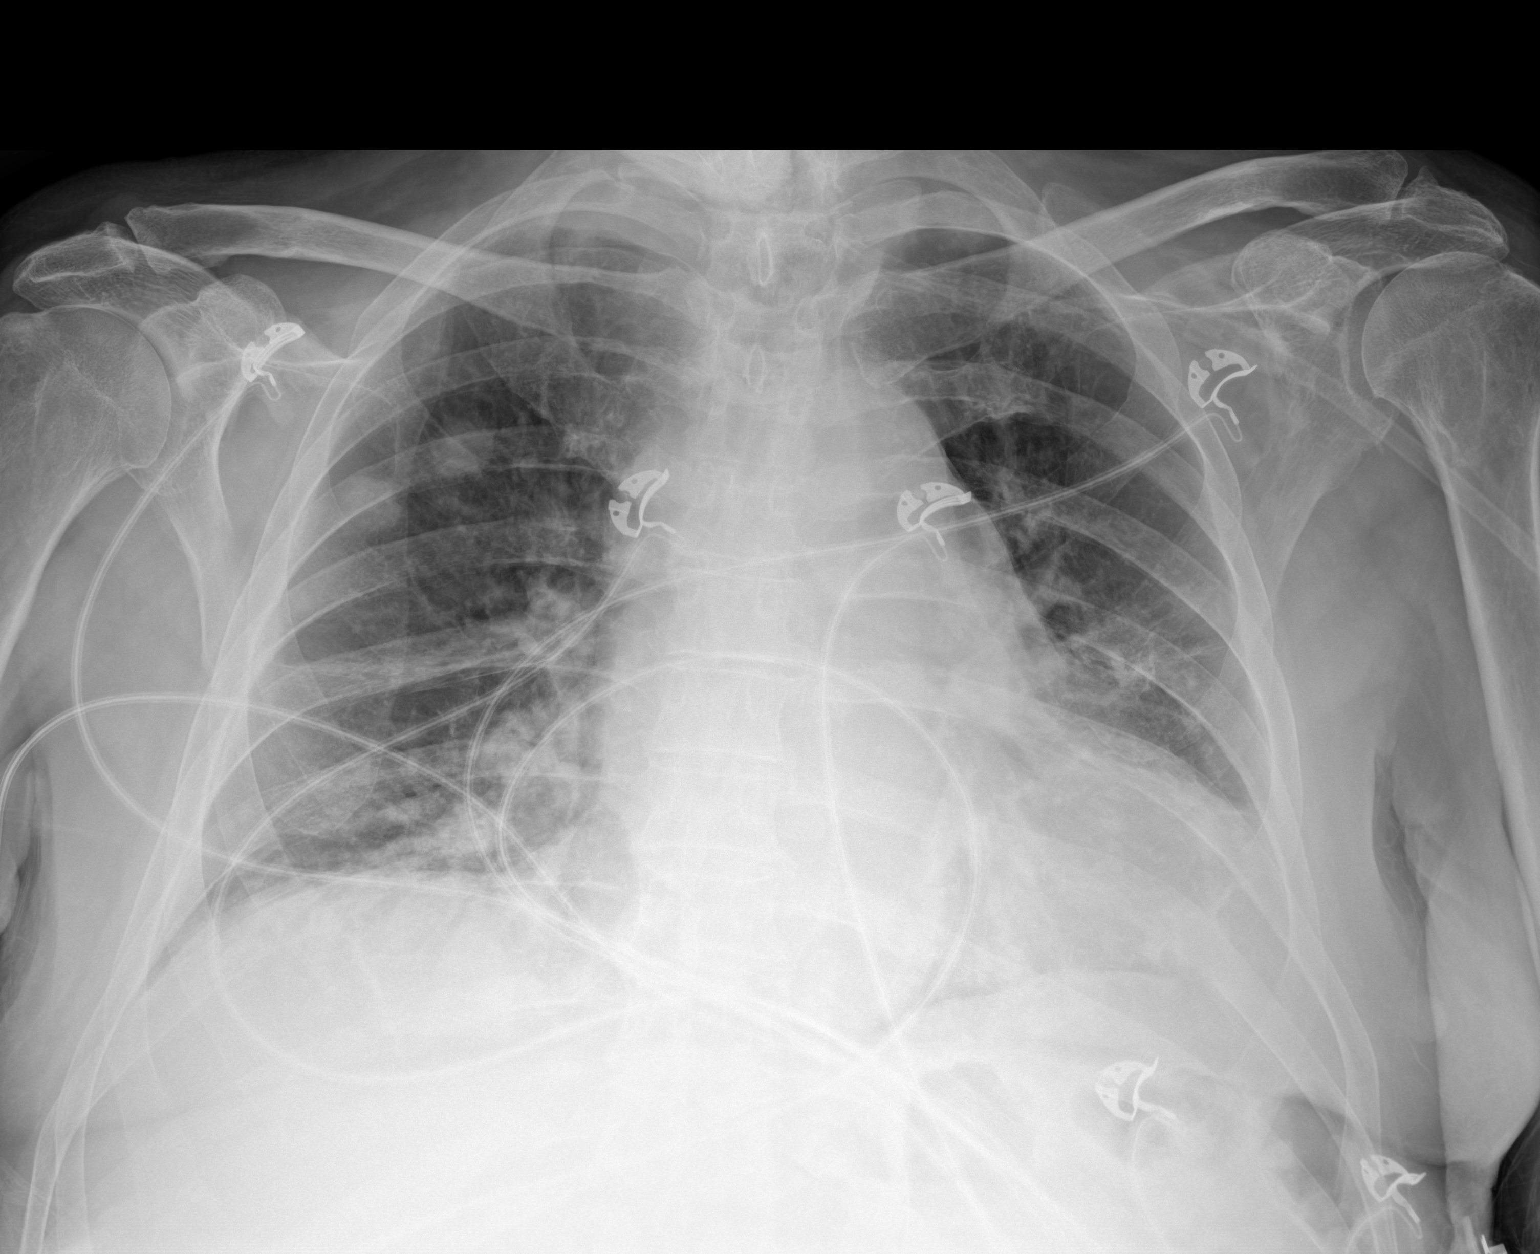

[1 of 1 positions shown; findings below may reference images not displayed]

FINDINGS: Cardiomegaly. Normal pulmonary vascularity. There are two pulmonary
nodules in the right upper lobe measuring up to 1.7 cm. Streaky
opacities at both medial lung bases, favor atelectasis. No focal
consolidation, pleural effusion, or pneumothorax. No acute osseous
abnormality.
IMPRESSION: 1. Two pulmonary nodules in the right upper lobe measuring up to
cm, concerning for metastatic disease.
2. Bibasilar atelectasis.
